# Patient Record
Sex: Male | Born: 2005 | Race: White | Hispanic: No | Marital: Single | Smoking: Never smoker
Health system: Southern US, Community
[De-identification: ages and names within clinical notes are randomized; demographics above are authoritative.]

## PROBLEM LIST (undated history)

## (undated) DIAGNOSIS — Z8489 Family history of other specified conditions: Secondary | ICD-10-CM

## (undated) DIAGNOSIS — J40 Bronchitis, not specified as acute or chronic: Secondary | ICD-10-CM

## (undated) DIAGNOSIS — F800 Phonological disorder: Secondary | ICD-10-CM

## (undated) DIAGNOSIS — L0591 Pilonidal cyst without abscess: Secondary | ICD-10-CM

## (undated) HISTORY — PX: ADENOIDECTOMY: SUR15

## (undated) HISTORY — PX: CLOSED REDUCTION CLAVICLE FRACTURE: SUR253

## (undated) HISTORY — DX: Phonological disorder: F80.0

## (undated) HISTORY — PX: TONSILLECTOMY: SUR1361

## (undated) HISTORY — PX: TYMPANOSTOMY TUBE PLACEMENT: SHX32

---
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
## 2023-01-12 ENCOUNTER — Other Ambulatory Visit: Payer: Self-pay

## 2023-01-12 ENCOUNTER — Ambulatory Visit: Payer: Medicaid Other | Admitting: Family Medicine

## 2023-01-12 ENCOUNTER — Other Ambulatory Visit (HOSPITAL_COMMUNITY): Payer: Self-pay

## 2023-01-12 ENCOUNTER — Encounter: Payer: Self-pay | Admitting: Family Medicine

## 2023-01-12 VITALS — BP 126/84 | HR 89 | Ht 68.0 in | Wt 264.0 lb

## 2023-01-12 DIAGNOSIS — M25512 Pain in left shoulder: Secondary | ICD-10-CM | POA: Diagnosis not present

## 2023-01-12 DIAGNOSIS — G8929 Other chronic pain: Secondary | ICD-10-CM

## 2023-01-12 MED ORDER — TIZANIDINE HCL 4 MG PO TABS
4.0000 mg | ORAL_TABLET | Freq: Three times a day (TID) | ORAL | 1 refills | Status: AC | PRN
  Filled 2023-01-12: qty 30, 10d supply, fill #0

## 2023-01-12 MED ORDER — MELOXICAM 15 MG PO TABS
ORAL_TABLET | ORAL | 3 refills | Status: AC
  Filled 2023-01-12: qty 30, 30d supply, fill #0

## 2023-01-12 NOTE — Progress Notes (Signed)
   I, Bryan Meadows, CMA acting as a scribe for Bryan Graham, MD.  Bryan Meadows. is a 17 y.o. male who presents to [facility] at [facility] today for shoulder pain. Pt was last seen by Dr. Denyse Amass on 04/27/22 for R shoulder pain and pronator syndrome.  Today, pt c/o left shoulder pain x 3 weeks. MOI:? Overhead reaching. Pt locates pain to posterior aspect of the shoulder. Sx worsening yesterday after shaking the vending machine, had tightness and SOB at the time. Also had pain while driving home from school. Denies current SOB. Sx worse with sneezing, coughing, twisting.   Aggravates: Motion and sleep Treatments tried: Nothing much yet  Dx imaging: 07/12/19 R shoulder XR  08/16/18 L shoulder & clavicle XR  Pertinent review of systems: No fevers or chills  Relevant historical information: Patient is in school to learn how to weld.  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   Exam:  BP 126/84   Pulse 89   Ht 5\' 8"  (1.727 m)   Wt 264 lb (119.7 kg)   SpO2 98%   BMI 40.14 kg/m  General: Well Developed, well nourished, and in no acute distress.   MSK: C-spine: Normal appearing Nontender palpation spinal midline. Tender palpation left rhomboid. Normal shoulder motion some pain with abduction. Intact strength.  Negative impingement testing.       Assessment and Plan: 17 y.o. male with left periscapular shoulder pain.  Pain is concentrated around the rhomboid region.  This been has been ongoing chronically now for about 3 weeks with an acute exacerbation yesterday after he pushed on a heavy vending machine.  He has suffered a muscle strain of the rhomboid muscle.  Plan for heating pad TENS unit percussive massager meloxicam tizanidine and Tylenol arthritis.  Plan for physical therapy as well.  If not improving consider further imaging.   PDMP not reviewed this encounter. Orders Placed This Encounter   Procedures   Ambulatory referral to Physical Therapy    Referral Priority:   Routine    Referral Type:   Physical Medicine    Referral Reason:   Specialty Services Required    Requested Specialty:   Physical Therapy    Number of Visits Requested:   1   Meds ordered this encounter  Medications   tiZANidine (ZANAFLEX) 4 MG tablet    Sig: Take 1 tablet (4 mg total) by mouth every 8 (eight) hours as needed.    Dispense:  30 tablet    Refill:  1   meloxicam (MOBIC) 15 MG tablet    Sig: Take 1 tablet by mouth in the morning with breakfast for 2 weeks, then daily as needed for pain.    Dispense:  30 tablet    Refill:  3     Discussed warning signs or symptoms. Please see discharge instructions. Patient expresses understanding.   The above documentation has been reviewed and is accurate and complete Bryan Meadows, M.D.

## 2023-01-12 NOTE — Patient Instructions (Addendum)
Thank you for coming in today.   Tylenol arthritis up to 2 every 8 hours.   OK to take with meloxicam.   Do not take meloxicam with ibuprofen.   Heating pad and TENS unit.   Percussive massager.    I've referred you to Physical Therapy.  Let us know if you don't hear from them in one week.

## 2023-01-14 ENCOUNTER — Telehealth: Payer: Self-pay | Admitting: Family Medicine

## 2023-01-14 DIAGNOSIS — G8929 Other chronic pain: Secondary | ICD-10-CM

## 2023-01-14 NOTE — Telephone Encounter (Signed)
[facility] rehab called, they stated we referred pt to them for dry needling but they are not providing that service at this time.

## 2023-01-17 NOTE — Telephone Encounter (Signed)
Dr. Denyse Amass, is there a different location that you would like to try to send pt to for dry-needling?

## 2023-01-18 NOTE — Addendum Note (Signed)
Addended by: Rodolph Bong on: 01/18/2023 07:24 AM   Modules accepted: Orders

## 2023-01-18 NOTE — Telephone Encounter (Signed)
I changed the location to [facility] in [place].

## 2023-01-18 NOTE — Telephone Encounter (Signed)
Called pt and advised. He will reach out if he hasn't heard from PT by the end of the week.

## 2023-01-27 ENCOUNTER — Other Ambulatory Visit: Payer: Self-pay

## 2023-01-27 ENCOUNTER — Ambulatory Visit (HOSPITAL_COMMUNITY): Payer: Medicaid Other | Attending: Family Medicine

## 2023-01-27 DIAGNOSIS — R29898 Other symptoms and signs involving the musculoskeletal system: Secondary | ICD-10-CM | POA: Insufficient documentation

## 2023-01-27 DIAGNOSIS — M25512 Pain in left shoulder: Secondary | ICD-10-CM | POA: Diagnosis present

## 2023-01-27 DIAGNOSIS — R293 Abnormal posture: Secondary | ICD-10-CM | POA: Insufficient documentation

## 2023-01-27 DIAGNOSIS — G8929 Other chronic pain: Secondary | ICD-10-CM | POA: Diagnosis present

## 2023-01-27 NOTE — Patient Instructions (Signed)

## 2023-01-27 NOTE — Therapy (Signed)
OUTPATIENT PHYSICAL THERAPY SHOULDER EVALUATION   Patient Name: Bryan Meadows. MRN: [id number] DOB:03-Nov-2005, 17 y.o., male Today's Date: 01/27/2023  END OF SESSION:  PT End of Session - 01/27/23 1331     Visit Number 1    Number of Visits 8    Date for PT Re-Evaluation 02/24/23    Authorization Type [place] Medicaid Healthy Blue (auth requested)    PT Start Time 1332    PT Stop Time 1420    PT Time Calculation (min) 48 min    Activity Tolerance Patient tolerated treatment well    Behavior During Therapy WFL for tasks assessed/performed             Past Medical History:  Diagnosis Date   Articulation disorder    moderate.   Asthma    Bronchitis    Family history of adverse reaction to anesthesia    dad wakes up "fighting"   Upper respiratory tract infection 02/13/2020   Past Surgical History:  Procedure Laterality Date   ADENOIDECTOMY     CLOSED REDUCTION CLAVICLE FRACTURE     PILONIDAL CYST EXCISION N/A 01/20/2021   Procedure: PILONIDAL CYSTECTOMY;  Surgeon: Manus Rudd, MD;  Location: [facility];  Service: General;  Laterality: N/A;   TONSILLECTOMY     TYMPANOSTOMY TUBE PLACEMENT     Patient Active Problem List   Diagnosis Date Noted   Contact dermatitis due to urushiol 04/06/2022   Asthma, mild intermittent 11/05/2015   Articulation disorder     PCP: Lynnea Ferrier, MD  REFERRING PROVIDER: Rodolph Bong, MD  REFERRING DIAG:  Diagnosis  [phone] (ICD-10-CM) - Chronic left shoulder pain    THERAPY DIAG:  Chronic left shoulder pain  Rationale for Evaluation and Treatment: Rehabilitation  ONSET DATE: 1 month  SUBJECTIVE:                                                                                                                                                                                      SUBJECTIVE STATEMENT: Shoulder has been hurting for about a month; shook a vending machine and it made his shoulder  cramp/spasm; made him catch his breath. 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  Has a note now no lifting overhead > 50 lbs or lifting over 100 lbs below shoulder height; sometimes shoulder cramps at night and wakes him  Hand dominance: Right  PERTINENT HISTORY: History of right shoulder pain years ago.    PAIN:  Are you having pain? Yes: NPRS scale:  0 to 6-7/10 Pain location: left  posterior shoulder Pain description: sharp pain, intermittent and varies Aggravating factors: prolonged movement Relieving factors: ibuprofen  PRECAUTIONS: None  RED FLAGS: None   WEIGHT BEARING RESTRICTIONS: No  FALLS:  Has patient fallen in last 6 months? No  OCCUPATION: Student (welding); works at [employer]  PLOF: Independent  PATIENT GOALS:no pain in my shoulder; sleep good at night  NEXT MD VISIT: PRN  OBJECTIVE:  Note: Objective measures were completed at Evaluation unless otherwise noted.  DIAGNOSTIC FINDINGS:  None recent  PATIENT SURVEYS:  Quick Dash 25/100 25%  COGNITION: Overall cognitive status: Within functional limits for tasks assessed     SENSATION: WFL  POSTURE: Forward head and rounded shoulders; slight slumped poster in sitting  UPPER EXTREMITY ROM:   Active ROM Right eval Left eval  Shoulder flexion 5 4+  Shoulder extension    Shoulder abduction 5 5  Shoulder adduction    Shoulder internal rotation 5 5  Shoulder external rotation 5 4  Elbow flexion    Elbow extension    Wrist flexion    Wrist extension    Wrist ulnar deviation    Wrist radial deviation    Wrist pronation    Wrist supination    (Blank rows = not tested)  UPPER EXTREMITY MMT:  MMT Right eval Left eval  Shoulder flexion 172 166  Shoulder extension    Shoulder abduction 180 170  Shoulder adduction    Shoulder internal rotation wfl wfl  Shoulder external rotation 88 66*  Middle  trapezius    Lower trapezius    Elbow flexion    Elbow extension    Wrist flexion    Wrist extension    Wrist ulnar deviation    Wrist radial deviation    Wrist pronation    Wrist supination    Grip strength (lbs)    (Blank rows = not tested)  SHOULDER SPECIAL TESTS:  Instability tests: Sulcus sign: negative Rotator cuff assessment: Drop arm test: negative Biceps assessment: Yergason's test: negative  JOINT MOBILITY TESTING:    PALPATION:  Tender left periscapular area; rhomboids, levator   TODAY'S TREATMENT:                                                                                                                                         DATE: 01/27/23 physical therapy evaluation and HEP instruction   PATIENT EDUCATION: Education details: Patient educated on exam findings, POC, scope of PT, HEP, and what to expect next visit. Person educated: Patient Education method: Explanation, Demonstration, and Handouts Education comprehension: verbalized understanding, returned demonstration, verbal cues required, and tactile cues required  HOME EXERCISE PROGRAM: Access Code: [email or web address] URL: [email or web address] Date: 01/27/2023 Prepared by: AP - Rehab  Exercises - Supine Shoulder External Rotation with Dowel  - 2 x daily - 7 x weekly - 1 sets - 10 reps - Seated Scapular Retraction  - 2 x  daily - 7 x weekly - 1 sets - 10 reps - 5" hold - Correct Seated Posture  - 1 x daily - 7 x weekly - 3 sets - 10 reps - Supine Mid-Thoracic Mobilization  - 1 x daily - 7 x weekly - 1 sets - 1 reps (use of tennis ball for trigger point release)  ASSESSMENT:  CLINICAL IMPRESSION: Patient is a 17 y.o. male who was seen today for physical therapy evaluation and treatment for chronic left shoulder pain.   Patient demonstrates decreased strength, ROM restriction, reduced flexibility, increased tenderness to palpation and postural abnormalities which are likely contributing to  symptoms of pain and are negatively impacting patient ability to perform ADLs. Patient will benefit from skilled physical therapy services to address these deficits to reduce pain and improve level of function with ADLs    OBJECTIVE IMPAIRMENTS: decreased activity tolerance, decreased mobility, decreased ROM, decreased strength, hypomobility, increased fascial restrictions, impaired perceived functional ability, increased muscle spasms, impaired flexibility, impaired UE functional use, and pain.   ACTIVITY LIMITATIONS: carrying, lifting, and reach over head  PARTICIPATION LIMITATIONS: driving, occupation, and school  PERSONAL FACTORS: Profession are also affecting patient's functional outcome.   REHAB POTENTIAL: Good  CLINICAL DECISION MAKING: Stable/uncomplicated  EVALUATION COMPLEXITY: Low   GOALS: Goals reviewed with patient? No  SHORT TERM GOALS: Target date: 02/10/2023  patient will be independent with initial HEP Baseline: Goal status: INITIAL  2.  Patient will self report 50% improvement to improve tolerance for functional activity  Baseline:  Goal status: INITIAL   LONG TERM GOALS: Target date: 02/24/2023  Patient will be independent in self management strategies to improve quality of life and functional outcomes.  Baseline:  Goal status: INITIAL  2.  Patient will self report 75% improvement to improve tolerance for functional activity  Baseline:  Goal status: INITIAL  3.  Patient will increase left shoulder external rotation to 80 degrees to improve ability to reach for work and school activities. Baseline: 66 Goal status: INITIAL  4.  Patient will be able to resume all normal work and school activities with pain no greater than 2/10 Baseline: 6/10 Goal status: INITIAL  5.  Patient will be able to sleep through the night without pain Baseline:  Goal status: INITIAL  6.  Patient will improve the QDASH score to 15 or less to demonstrate improved  perceived function left UE.  Baseline: 25/100 Goal status: INITIAL  PLAN:  PT FREQUENCY: 2x/week  PT DURATION: 4 weeks  PLANNED INTERVENTIONS: 97164- PT Re-evaluation, 97110-Therapeutic exercises, 97530- Therapeutic activity, 97112- Neuromuscular re-education, 97535- Self Care, 95621- Manual therapy, [phone]- Gait training, [phone]- Orthotic Fit/training, [phone]- Canalith repositioning, [id number]- Aquatic Therapy, [phone]- Splinting, Patient/Family education, Balance training, Stair training, Taping, Dry Needling, Joint mobilization, Joint manipulation, Spinal manipulation, Spinal mobilization, Scar mobilization, and DME instructions.   PLAN FOR NEXT SESSION: Review HEP and goals; consider dry needling if symptoms do not improve after a couple visits; continue with left shoulder mobility and strengthening, postural strengthening; check cervical spine mobility; add doorway stretch; manual as needed.    2:17 PM, 01/27/23 Shanin Szymanowski Small Mohogany Toppins MPT [facility] [place] [phone] [phone]   Managed Medicaid Authorization Request  Visit Dx Codes: M25.512, G89.29, R29.3; R29.898  Functional Tool Score: QDASH 25/100; 25%  For all possible CPT codes, reference the Planned Interventions line above.     Check all conditions that are expected to impact treatment: {Conditions expected to impact treatment:None of these apply   If treatment  provided at initial evaluation, no treatment charged due to lack of authorization.

## 2023-02-11 ENCOUNTER — Ambulatory Visit (HOSPITAL_COMMUNITY): Payer: Medicaid Other | Attending: Family Medicine

## 2023-02-11 DIAGNOSIS — G8929 Other chronic pain: Secondary | ICD-10-CM | POA: Diagnosis present

## 2023-02-11 DIAGNOSIS — R29898 Other symptoms and signs involving the musculoskeletal system: Secondary | ICD-10-CM | POA: Diagnosis present

## 2023-02-11 DIAGNOSIS — R293 Abnormal posture: Secondary | ICD-10-CM | POA: Insufficient documentation

## 2023-02-11 DIAGNOSIS — M25512 Pain in left shoulder: Secondary | ICD-10-CM | POA: Insufficient documentation

## 2023-02-11 NOTE — Therapy (Signed)
 OUTPATIENT PHYSICAL THERAPY SHOULDER TREATMENT/ DISCHARGE PHYSICAL THERAPY DISCHARGE SUMMARY  Visits from Start of Care: 2  Current functional level related to goals / functional outcomes: See below   Remaining deficits: See below   Education / Equipment: HEP   Patient agrees to discharge. Patient goals were met. Patient is being discharged due to meeting the stated rehab goals.    Patient Name: Bryan Meadows. MRN: [id number] DOB:March 26, 2005, 18 y.o., male Today's Date: 02/11/2023  END OF SESSION:  PT End of Session - 02/11/23 1128     Visit Number 2    Number of Visits 8    Date for PT Re-Evaluation 02/24/23    Authorization Type [place] Medicaid Healthy Blue (auth requested)    Authorization Time Period 5 visits from 01/27/23 to 03/27/23    Authorization - Visit Number 1    Authorization - Number of Visits 5    PT Start Time 1129    PT Stop Time 1145    PT Time Calculation (min) 16 min    Activity Tolerance Patient tolerated treatment well    Behavior During Therapy [facility] for tasks assessed/performed             Past Medical History:  Diagnosis Date   Articulation disorder    moderate.   Asthma    Bronchitis    Family history of adverse reaction to anesthesia    dad wakes up fighting   Upper respiratory tract infection 02/13/2020   Past Surgical History:  Procedure Laterality Date   ADENOIDECTOMY     CLOSED REDUCTION CLAVICLE FRACTURE     PILONIDAL CYST EXCISION N/A 01/20/2021   Procedure: PILONIDAL CYSTECTOMY;  Surgeon: Belinda Cough, MD;  Location: [facility];  Service: General;  Laterality: N/A;   TONSILLECTOMY     TYMPANOSTOMY TUBE PLACEMENT     Patient Active Problem List   Diagnosis Date Noted   Contact dermatitis due to urushiol 04/06/2022   Asthma, mild intermittent 11/05/2015   Articulation disorder     PCP: Butler Burr, MD  REFERRING PROVIDER: Joane Artist RAMAN, MD  REFERRING DIAG:  Diagnosis  [phone]  (ICD-10-CM) - Chronic left shoulder pain    THERAPY DIAG:  Chronic left shoulder pain  Abnormal posture  Other symptoms and signs involving the musculoskeletal system  Rationale for Evaluation and Treatment: Rehabilitation  ONSET DATE: 1 month  SUBJECTIVE:                                                                                                                                                                                      SUBJECTIVE STATEMENT: Much better!  Feels like 85%-90%  better   EVAL:Shoulder has been hurting for about a month; shook a vending machine and it made his shoulder cramp/spasm; made him catch his breath. 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  Has a note now no lifting overhead > 50 lbs or lifting over 100 lbs below shoulder height; sometimes shoulder cramps at night and wakes him  Hand dominance: Right  PERTINENT HISTORY: History of right shoulder pain years ago.    PAIN:  Are you having pain? Yes: NPRS scale:  0 to 6-7/10 Pain location: left posterior shoulder Pain description: sharp pain, intermittent and varies Aggravating factors: prolonged movement Relieving factors: ibuprofen   PRECAUTIONS: None  RED FLAGS: None   WEIGHT BEARING RESTRICTIONS: No  FALLS:  Has patient fallen in last 6 months? No  OCCUPATION: Student (welding); works at [employer]  PLOF: Independent  PATIENT GOALS:no pain in my shoulder; sleep good at night  NEXT MD VISIT: PRN  OBJECTIVE:  Note: Objective measures were completed at Evaluation unless otherwise noted.  DIAGNOSTIC FINDINGS:  None recent  PATIENT SURVEYS:  Quick Dash 25/100 25%  COGNITION: Overall cognitive status: Within functional limits for tasks assessed     SENSATION: WFL  POSTURE: Forward head and rounded shoulders; slight slumped poster in sitting  UPPER EXTREMITY ROM:   Active ROM  Right eval Left eval Left 02/11/23  Shoulder flexion 5 4+ 5  Shoulder extension     Shoulder abduction 5 5   Shoulder adduction     Shoulder internal rotation 5 5   Shoulder external rotation 5 4 5   Elbow flexion     Elbow extension     Wrist flexion     Wrist extension     Wrist ulnar deviation     Wrist radial deviation     Wrist pronation     Wrist supination     (Blank rows = not tested)  UPPER EXTREMITY MMT:  MMT Right eval Left eval Left 02/11/23  Shoulder flexion 172 166 170  Shoulder extension     Shoulder abduction 180 170   Shoulder adduction     Shoulder internal rotation wfl wfl   Shoulder external rotation 88 66* 90  Middle trapezius     Lower trapezius     Elbow flexion     Elbow extension     Wrist flexion     Wrist extension     Wrist ulnar deviation     Wrist radial deviation     Wrist pronation     Wrist supination     Grip strength (lbs)     (Blank rows = not tested)  SHOULDER SPECIAL TESTS:  Instability tests: Sulcus sign: negative Rotator cuff assessment: Drop arm test: negative Biceps assessment: Yergason's test: negative  JOINT MOBILITY TESTING:    PALPATION:  Tender left periscapular area; rhomboids, levator   TODAY'S TREATMENT:  DATE:  02/11/23 reasess see above 01/27/23 physical therapy evaluation and HEP instruction   PATIENT EDUCATION: Education details: Patient educated on exam findings, POC, scope of PT, HEP, and what to expect next visit. Person educated: Patient Education method: Explanation, Demonstration, and Handouts Education comprehension: verbalized understanding, returned demonstration, verbal cues required, and tactile cues required  HOME EXERCISE PROGRAM: Access Code: [email or web address] URL: [email or web address] Date: 01/27/2023 Prepared by: AP - Rehab  Exercises -  Supine Shoulder External Rotation with Dowel  - 2 x daily - 7 x weekly - 1 sets - 10 reps - Seated Scapular Retraction  - 2 x daily - 7 x weekly - 1 sets - 10 reps - 5 hold - Correct Seated Posture  - 1 x daily - 7 x weekly - 3 sets - 10 reps - Supine Mid-Thoracic Mobilization  - 1 x daily - 7 x weekly - 1 sets - 1 reps (use of tennis ball for trigger point release)  ASSESSMENT:  CLINICAL IMPRESSION: Reassessed patient today as he feel close to his baseline. Patient has met all set rehab goals and is agreeable to discharge at this time.    Eval:Patient is a 18 y.o. male who was seen today for physical therapy evaluation and treatment for chronic left shoulder pain.   Patient demonstrates decreased strength, ROM restriction, reduced flexibility, increased tenderness to palpation and postural abnormalities which are likely contributing to symptoms of pain and are negatively impacting patient ability to perform ADLs. Patient will benefit from skilled physical therapy services to address these deficits to reduce pain and improve level of function with ADLs    OBJECTIVE IMPAIRMENTS: decreased activity tolerance, decreased mobility, decreased ROM, decreased strength, hypomobility, increased fascial restrictions, impaired perceived functional ability, increased muscle spasms, impaired flexibility, impaired UE functional use, and pain.   ACTIVITY LIMITATIONS: carrying, lifting, and reach over head  PARTICIPATION LIMITATIONS: driving, occupation, and school  PERSONAL FACTORS: Profession are also affecting patient's functional outcome.   REHAB POTENTIAL: Good  CLINICAL DECISION MAKING: Stable/uncomplicated  EVALUATION COMPLEXITY: Low   GOALS: Goals reviewed with patient? No  SHORT TERM GOALS: Target date: 02/10/2023  patient will be independent with initial HEP Baseline: Goal status: INITIAL  2.  Patient will self report 50% improvement to improve tolerance for functional  activity  Baseline:  Goal status: INITIAL   LONG TERM GOALS: Target date: 02/24/2023  Patient will be independent in self management strategies to improve quality of life and functional outcomes.  Baseline:  Goal status: INITIAL  2.  Patient will self report 75% improvement to improve tolerance for functional activity  Baseline:  Goal status: INITIAL  3.  Patient will increase left shoulder external rotation to 80 degrees to improve ability to reach for work and school activities. Baseline: 66 Goal status: INITIAL  4.  Patient will be able to resume all normal work and school activities with pain no greater than 2/10 Baseline: 6/10 Goal status: INITIAL  5.  Patient will be able to sleep through the night without pain Baseline:  Goal status: INITIAL  6.  Patient will improve the QDASH score to 15 or less to demonstrate improved perceived function left UE.  Baseline: 25/100 Goal status: INITIAL  PLAN:  PT FREQUENCY: 2x/week  PT DURATION: 4 weeks  PLANNED INTERVENTIONS: 97164- PT Re-evaluation, 97110-Therapeutic exercises, 97530- Therapeutic activity, [id number]- Neuromuscular re-education, 97535- Self Care, 02859- Manual therapy, [phone]- Gait training, [phone]- Orthotic Fit/training, [phone]- Canalith repositioning, [id number]- Aquatic Therapy, [phone]- Splinting, Patient/Family education, Balance training, Stair  training, Taping, Dry Needling, Joint mobilization, Joint manipulation, Spinal manipulation, Spinal mobilization, Scar mobilization, and DME instructions.   PLAN FOR NEXT SESSION: discharge   11:46 AM, 02/11/23 Alyshia Kernan Small Kimble Delaurentis MPT [facility] [place] [phone]

## 2023-02-14 ENCOUNTER — Emergency Department (HOSPITAL_COMMUNITY): Payer: Medicaid Other

## 2023-02-14 ENCOUNTER — Emergency Department (HOSPITAL_COMMUNITY)
Admission: EM | Admit: 2023-02-14 | Discharge: 2023-02-15 | Discharge status: Against Medical Advice | Payer: Medicaid Other | Source: Home / Self Care

## 2023-02-14 ENCOUNTER — Emergency Department (HOSPITAL_COMMUNITY)
Admission: EM | Admit: 2023-02-14 | Discharge: 2023-02-14 | Disposition: A | Discharge status: Home / Self Care | Payer: Medicaid Other | Attending: Emergency Medicine | Admitting: Emergency Medicine

## 2023-02-14 ENCOUNTER — Other Ambulatory Visit (HOSPITAL_COMMUNITY): Payer: Self-pay

## 2023-02-14 ENCOUNTER — Encounter (HOSPITAL_COMMUNITY): Payer: Self-pay | Admitting: *Deleted

## 2023-02-14 ENCOUNTER — Other Ambulatory Visit: Payer: Self-pay

## 2023-02-14 DIAGNOSIS — R1032 Left lower quadrant pain: Secondary | ICD-10-CM | POA: Insufficient documentation

## 2023-02-14 DIAGNOSIS — Z7951 Long term (current) use of inhaled steroids: Secondary | ICD-10-CM | POA: Diagnosis not present

## 2023-02-14 DIAGNOSIS — R1012 Left upper quadrant pain: Secondary | ICD-10-CM | POA: Diagnosis not present

## 2023-02-14 DIAGNOSIS — Z5321 Procedure and treatment not carried out due to patient leaving prior to being seen by health care provider: Secondary | ICD-10-CM | POA: Diagnosis not present

## 2023-02-14 DIAGNOSIS — R1033 Periumbilical pain: Secondary | ICD-10-CM | POA: Insufficient documentation

## 2023-02-14 DIAGNOSIS — J45909 Unspecified asthma, uncomplicated: Secondary | ICD-10-CM | POA: Insufficient documentation

## 2023-02-14 DIAGNOSIS — R109 Unspecified abdominal pain: Secondary | ICD-10-CM | POA: Insufficient documentation

## 2023-02-14 LAB — COMPREHENSIVE METABOLIC PANEL
ALT: 38 U/L (ref 0–44)
AST: 24 U/L (ref 15–41)
Albumin: 4.7 g/dL (ref 3.5–5.0)
Alkaline Phosphatase: 78 U/L (ref 38–126)
Anion gap: 9 (ref 5–15)
BUN: 12 mg/dL (ref 6–20)
CO2: 22 mmol/L (ref 22–32)
Calcium: 9.4 mg/dL (ref 8.9–10.3)
Chloride: 104 mmol/L (ref 98–111)
Creatinine, Ser: 0.86 mg/dL (ref 0.61–1.24)
GFR, Estimated: >60 mL/min (ref >60)
Glucose, Bld: 105 mg/dL — ABNORMAL HIGH (ref 70–99)
Potassium: 4.3 mmol/L (ref 3.5–5.1)
Sodium: 135 mmol/L (ref 135–145)
Total Bilirubin: 0.9 mg/dL (ref 0.0–1.2)
Total Protein: 7.6 g/dL (ref 6.5–8.1)

## 2023-02-14 LAB — URINALYSIS, ROUTINE W REFLEX MICROSCOPIC
Bilirubin Urine: NEGATIVE
Glucose, UA: NEGATIVE mg/dL
Hgb urine dipstick: NEGATIVE
Ketones, ur: NEGATIVE mg/dL
Leukocytes,Ua: NEGATIVE
Nitrite: NEGATIVE
Protein, ur: NEGATIVE mg/dL
Specific Gravity, Urine: >1.046 — ABNORMAL HIGH (ref 1.005–1.030)
pH: 6 (ref 5.0–8.0)

## 2023-02-14 LAB — CBC WITH DIFFERENTIAL/PLATELET
Abs Immature Granulocytes: 0.01 10*3/uL (ref 0.00–0.07)
Basophils Absolute: 0.1 10*3/uL (ref 0.0–0.1)
Basophils Relative: 1 %
Eosinophils Absolute: 0.2 10*3/uL (ref 0.0–0.5)
Eosinophils Relative: 3 %
HCT: 45.8 % (ref 39.0–52.0)
Hemoglobin: 16.1 g/dL (ref 13.0–17.0)
Immature Granulocytes: 0 %
Lymphocytes Relative: 23 %
Lymphs Abs: 1.9 10*3/uL (ref 0.7–4.0)
MCH: 30 pg (ref 26.0–34.0)
MCHC: 35.2 g/dL (ref 30.0–36.0)
MCV: 85.4 fL (ref 80.0–100.0)
Monocytes Absolute: 0.6 10*3/uL (ref 0.1–1.0)
Monocytes Relative: 8 %
Neutro Abs: 5.3 10*3/uL (ref 1.7–7.7)
Neutrophils Relative %: 65 %
Platelets: 237 10*3/uL (ref 150–400)
RBC: 5.36 MIL/uL (ref 4.22–5.81)
RDW: 12 % (ref 11.5–15.5)
WBC: 8.1 10*3/uL (ref 4.0–10.5)
nRBC: 0 % (ref 0.0–0.2)

## 2023-02-14 LAB — LIPASE, BLOOD: Lipase: 26 U/L (ref 11–51)

## 2023-02-14 MED ORDER — IOHEXOL 300 MG/ML  SOLN
100.0000 mL | Freq: Once | INTRAMUSCULAR | Status: AC | PRN
  Administered 2023-02-14: 100 mL via INTRAVENOUS

## 2023-02-14 MED ORDER — DICYCLOMINE HCL 10 MG PO CAPS
20.0000 mg | ORAL_CAPSULE | Freq: Once | ORAL | Status: AC
  Administered 2023-02-14: 20 mg via ORAL
  Filled 2023-02-14: qty 2

## 2023-02-14 MED ORDER — DICYCLOMINE HCL 20 MG PO TABS
20.0000 mg | ORAL_TABLET | Freq: Three times a day (TID) | ORAL | 0 refills | Status: AC
  Filled 2023-02-14: qty 20, 7d supply, fill #0

## 2023-02-14 MED ORDER — KETOROLAC TROMETHAMINE 15 MG/ML IJ SOLN
15.0000 mg | Freq: Once | INTRAMUSCULAR | Status: AC
  Administered 2023-02-14: 15 mg via INTRAVENOUS
  Filled 2023-02-14: qty 1

## 2023-02-14 NOTE — ED Triage Notes (Signed)
 Reports abd pain mid abd, stared last night. Denies N/V/D

## 2023-02-14 NOTE — Discharge Instructions (Addendum)
 If you develop worsening, continued, or recurrent abdominal pain, uncontrolled vomiting, fever, chest or back pain, or any other new/concerning symptoms then return to the ER for evaluation.

## 2023-02-14 NOTE — ED Triage Notes (Signed)
 Patient from home for abd pain that started last night. Was seen at [facility] earlier today and left with no answers. Denies any N/V/D, urinary changes, or constipation. Last BM approx 1.5 hours ago and states it was normal. Upon arrival to ER, patient is alert and oriented, ambu

## 2023-02-14 NOTE — ED Provider Notes (Signed)
 [place] EMERGENCY DEPARTMENT AT [facility] Provider Note   CSN: [id number] Arrival date & time: 02/14/23  9141     History  Chief Complaint  Patient presents with   Abdominal Pain    Bryan Meadows. is a 18 y.o. male.  HPI 18 year old man with a history of asthma presents with severe abdominal pain.  He states that his woke him up from sleep around 2 AM.  Took some ibuprofen  which did not help.  The pain is primarily umbilical and rated as a 10/10.  Feels like a cramping.  No urinary symptoms.  He had a couple episodes of diarrhea yesterday but was not having the pain at the time.  Pain is about the same as when it first started.  No prior history of abdominal issues.  No fevers or vomiting.  Home Medications Prior to Admission medications   Medication Sig Start Date End Date Taking? Authorizing Provider  dicyclomine  (BENTYL ) 20 MG tablet Take 1 tablet (20 mg total) by mouth 3 (three) times daily before meals. 02/14/23  Yes Freddi Hamilton, MD  albuterol  (PROVENTIL ) (2.5 MG/3ML) 0.083% nebulizer solution Take 3 mLs (2.5 mg total) by nebulization every 4 (four) hours as needed for wheezing. For shortness of breath 12/31/19   Duanne Butler DASEN, MD  albuterol  (VENTOLIN  HFA) 108 [phone] Base) MCG/ACT inhaler Inhale 1-2 puffs into the lungs every 6 (six) hours as needed for wheezing or shortness of breath. 01/21/22   Kayla Jeoffrey RAMAN, FNP  Clobetasol  Prop Emollient Base (CLOBETASOL  PROPIONATE E) 0.05 % emollient cream Apply 1 Application topically 2 (two) times daily. 07/15/22   Kayla Jeoffrey RAMAN, FNP  meloxicam  (MOBIC ) 15 MG tablet Take 1 tablet by mouth in the morning with breakfast for 2 weeks, then daily as needed for pain. 01/12/23   Joane Artist RAMAN, MD  penicillin  v potassium (VEETID) 500 MG tablet Take 2 tablets by mouth now, then 1 tablet 4 times daily until finished for dental infection. 09/10/22   Stuart Clancy Heidelberg, DDS  tiZANidine  (ZANAFLEX ) 4 MG tablet Take 1 tablet (4 mg  total) by mouth every 8 (eight) hours as needed. 01/12/23   Corey, Evan S, MD      Allergies    Patient has no known allergies.    Review of Systems   Review of Systems  Constitutional:  Negative for fever.  Gastrointestinal:  Positive for abdominal pain. Negative for vomiting.  Genitourinary:  Negative for dysuria.    Physical Exam Updated Vital Signs BP 132/78   Pulse 70   Temp 98 F (36.7 C)   Resp 15   Ht 5' 8 (1.727 m)   Wt 108.9 kg   SpO2 100%   BMI 36.49 kg/m  Physical Exam Vitals and nursing note reviewed.  Constitutional:      General: He is not in acute distress.    Appearance: He is well-developed. He is obese. He is not ill-appearing or diaphoretic.     Comments: Sitting up, resting comfortably, no acute distress  HENT:     Head: Normocephalic and atraumatic.  Cardiovascular:     Rate and Rhythm: Normal rate and regular rhythm.     Heart sounds: Normal heart sounds.  Pulmonary:     Effort: Pulmonary effort is normal.     Breath sounds: Normal breath sounds.  Abdominal:     Palpations: Abdomen is soft.     Tenderness: There is abdominal tenderness in the periumbilical area, left upper quadrant and  left lower quadrant.  Skin:    General: Skin is warm and dry.  Neurological:     Mental Status: He is alert.     ED Results / Procedures / Treatments   Labs (all labs ordered are listed, but only abnormal results are displayed) Labs Reviewed  COMPREHENSIVE METABOLIC PANEL - Abnormal; Notable for the following components:      Result Value   Glucose, Bld 105 (*)    All other components within normal limits  URINALYSIS, ROUTINE W REFLEX MICROSCOPIC - Abnormal; Notable for the following components:   Specific Gravity, Urine >1.046 (*)    All other components within normal limits  LIPASE, BLOOD  CBC WITH DIFFERENTIAL/PLATELET    EKG None  Radiology CT ABDOMEN PELVIS W CONTRAST Result Date: 02/14/2023 CLINICAL DATA:  Mid abdominal pain starting last  night EXAM: CT ABDOMEN AND PELVIS WITH CONTRAST TECHNIQUE: Multidetector CT imaging of the abdomen and pelvis was performed using the standard protocol following bolus administration of intravenous contrast. RADIATION DOSE REDUCTION: This exam was performed according to the departmental dose-optimization program which includes automated exposure control, adjustment of the mA and/or kV according to patient size and/or use of iterative reconstruction technique. CONTRAST:  OMNIPAQUE  IOHEXOL  300 MG/ML  SOLN COMPARISON:  None Available. 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 Loss of disc height with rudimentary disc material at this level. There is mild right foraminal impingement at this level due to intervertebral spurring. Grade 1 degenerative retrolisthesis at L3-4 and grade 1 degenerative anterolisthesis at L5-S1. Central disc protrusion along with facet arthropathy at L3-4 contribute to moderate left eccentric central narrowing of the thecal sac and right greater than left bilateral foraminal stenosis. L5-S1, facet spurring causes mild left foraminal stenosis. IMPRESSION: 1. No acute findings. 2. 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 There is mild right foraminal impingement at this level due to intervertebral spurring. 3. Central disc protrusion along with facet arthropathy at L3-4 contribute to moderate left eccentric central narrowing of the thecal sac and right greater than left bilateral foraminal stenosis. 4. Mild left foraminal stenosis at L5-S1 due to facet spurring. Electronically  Signed   By: Ryan Salvage M.D.   On: 02/14/2023 11:44    Procedures Procedures    Medications Ordered in ED Medications  ketorolac  (TORADOL ) 15 MG/ML injection 15 mg (15 mg Intravenous Given 02/14/23 1012)  iohexol  (OMNIPAQUE ) 300 MG/ML solution 100 mL (100 mLs Intravenous Contrast Given 02/14/23 1058)  dicyclomine  (BENTYL ) capsule 20 mg (20 mg Oral Given 02/14/23 1259)    ED Course/ Medical Decision Making/ A&P                                 Medical Decision Making Amount and/or Complexity of Data Reviewed Labs:     Details: Normal WBC Radiology: independent interpretation performed.    Details: No diverticulitis  Risk Prescription drug management.   Unclear what is causing the patient's abdominal pain.  CT is reassuring.  Lab work is unremarkable.  Had some moderate relief with Toradol  and then was given some Bentyl  and feels a lot better.  Unclear of the cause but there are no emergent conditions  found, he is feeling better, and I think stable for discharge to follow-up with his PCP.  He is tolerating p.o. fluids and food.  There are some chronic spinal abnormalities that do not appear to be symptomatic at this time and he can follow-up with his PCP for this as well.  Otherwise stable for discharge home with return precautions.        Final Clinical Impression(s) / ED Diagnoses Final diagnoses:  Periumbilical abdominal pain    Rx / DC Orders ED Discharge Orders          Ordered    dicyclomine  (BENTYL ) 20 MG tablet  3 times daily before meals        02/14/23 1430              Freddi Hamilton, MD 02/14/23 1523

## 2023-02-14 NOTE — ED Provider Triage Note (Signed)
 Emergency Medicine Provider Triage Evaluation Note  Bryan Meadows. , a 18 y.o. male  was evaluated in triage.  Pt complains of abdominal pain.  Review of Systems  Positive:  Negative:   Physical Exam  BP (!) 141/107 (BP Location: Left Arm)   Pulse 77   Temp 97.6 F (36.4 C) (Oral)   Resp 18   SpO2 97%  Gen:   Awake, no distress   Resp:  Normal effort  MSK:   Moves extremities without difficulty  Other:   Medical Decision Making  Medically screening exam initiated at 9:23 AM.  Appropriate orders placed.  Bryan Meadows. was informed that the remainder of the evaluation will be completed by another provider, this initial triage assessment does not replace that evaluation, and the importance of remaining in the ED until their evaluation is complete.  Central abdominal pain since last night. Patient stating that he does not otherwise feel sick. Patient last urinated around 3AM. Last BM yesterday.   Denies fever, chest pain, dyspnea, cough, nausea, vomiting, diarrhea, dysuria, hematuria, hematochezia.     Bryan Meadows, [place] 02/14/23 [phone]

## 2023-02-17 ENCOUNTER — Inpatient Hospital Stay: Payer: Medicaid Other | Admitting: Family Medicine

## 2023-02-18 ENCOUNTER — Encounter (HOSPITAL_COMMUNITY): Payer: Medicaid Other

## 2023-02-23 ENCOUNTER — Encounter (HOSPITAL_COMMUNITY): Payer: Medicaid Other

## 2023-04-06 ENCOUNTER — Other Ambulatory Visit (HOSPITAL_COMMUNITY): Payer: Self-pay

## 2023-04-06 MED ORDER — DOXYCYCLINE HYCLATE 100 MG PO TABS
100.0000 mg | ORAL_TABLET | Freq: Two times a day (BID) | ORAL | 0 refills | Status: AC
  Filled 2023-04-06: qty 14, 7d supply, fill #0

## 2023-04-07 ENCOUNTER — Other Ambulatory Visit (HOSPITAL_COMMUNITY): Payer: Self-pay

## 2023-05-02 ENCOUNTER — Ambulatory Visit: Payer: Self-pay | Admitting: Surgery

## 2023-05-02 NOTE — H&P (View-Only) (Signed)
 Subjective    Chief Complaint: Follow-up (F/u pilonidal cyst)       History of Present Illness: Bryan Meadows is a 18 y.o. male who is seen today as an office consultation at the request of Dr. Tanya Nones for evaluation of Follow-up (F/u pilonidal cyst) .   Bryan Meadows is a 18 y.o. male who is presenting per request of [name] A who evaluated him on 11/27/20 for a pilonidal cyst.  He underwent bedside incision and drainage evacuating a moderate amount of purulent material with hair.     On 01/20/21, he underwent pilonidal cystectomy with primary closure over a Penrose drain by Dr. [name].  The drain came out on its own shortly after Christmas.  On 02/24/2021, there was a small opening at the lower end of the incision with some protruding granulation tissue.  The upper end of the incision was cauterized with silver nitrate.     Dr. [name] saw him on 04/03/21 at which time the posterior end of the incision was healing well but the lowest end of the incision still had a 1 cm opening.  He he was seen in urgent office 04/07/21 for I&D of superior aspect of incision, releasing purulent fluid.  He followed up with Dr. [name] at which time the area was healed.   His mother states it filled up with fluid in July but did not drain and resolved on its own.   11/27/2021, he presented to the office with pain, redness, swelling at the left superior gluteal cleft x 2 days.  He underwent bedside aspiration of 7 cc thick foul-smelling fluid followed by I&D releasing more purulence.  He is here for recheck and states the area is no longer painful.  It is not draining.  Denies fever/chills.  At that time, we recommended pilonidal cystectomy.  The patient deferred but returned to the office on 04/06/2023 with a recurrent abscess to the left of the superior portion of the gluteal cleft.  This was drained by Dr. [name].  That wound has now healed but he still has a persistent mass in this area.  He still  has hair growing from 2 open skin pits.       Medical History: Past Medical History      Past Medical History:  Diagnosis Date   Asthma, unspecified asthma severity, unspecified whether complicated, unspecified whether persistent (HHS-HCC)          Problem List     Patient Active Problem List  Diagnosis   Articulation disorder   Asthma, mild intermittent (HHS-HCC)   Bleeding nose   Right wrist pain        Past Surgical History       Past Surgical History:  Procedure Laterality Date   TONSILLECTOMY & ADENOIDECTOMY   03/2006   I&D Pilonidal Cyst   11/27/2020    [name], NP        Allergies  No Known Allergies     Medications Ordered Prior to Encounter        Current Outpatient Medications on File Prior to Visit  Medication Sig Dispense Refill   albuterol (PROVENTIL) 2.5 mg /3 mL (0.083 %) nebulizer solution Inhale into the lungs       beclomethasone dipropionate (QVAR REDIHALER HFA) 80 mcg/actuation inhaler Inhale into the lungs 2 (two) times daily        No current facility-administered medications on file prior to visit.        Family History  History reviewed. No pertinent family history.      Tobacco Use History  Social History       Tobacco Use  Smoking Status Never  Smokeless Tobacco Never        Social History  Social History        Socioeconomic History   Marital status: Single  Tobacco Use   Smoking status: Never   Smokeless tobacco: Never  Vaping Use   Vaping status: Never Used  Substance and Sexual Activity   Alcohol use: Never   Drug use: Never        Objective:         Vitals:    05/02/23 0832  PainSc: 0-No pain    Physical Exam    Constitutional:  WDWN in NAD, conversant, no obvious deformities; lying in bed comfortably No active infection noted in the pilonidal region.  He has at least 2 open skin pits with protruding hair.  To the left of the posterior portion of this area, there is a 1 cm area of  subcutaneous firmness.  The more inferior portion of the region shows a well-healed scar with no sign of infection or inflammation.       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  Recommend excision of this area.  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  We will try to schedule this on a Friday so he can recover over the weekend and not miss any of his classes.   Pilonidal cystectomy.  The surgical procedure has been discussed with the patient.  Potential risks, benefits, alternative treatments, and expected outcomes have been explained.  All of the patient's questions at this time have been answered.  The likelihood of reaching the patient's treatment goal is good.  The patient understands the proposed surgical procedure and wishes to proceed.     [name], MD  05/02/2023 9:40 AM

## 2023-05-02 NOTE — H&P (Signed)
 Subjective    Chief Complaint: Follow-up (F/u pilonidal cyst)       History of Present Illness: Bryan Meadows is a 18 y.o. male who is seen today as an office consultation at the request of Dr. Tanya Nones for evaluation of Follow-up (F/u pilonidal cyst) .   Bryan Meadows is a 18 y.o. male who is presenting per request of [name] A who evaluated him on 11/27/20 for a pilonidal cyst.  He underwent bedside incision and drainage evacuating a moderate amount of purulent material with hair.     On 01/20/21, he underwent pilonidal cystectomy with primary closure over a Penrose drain by Dr. [name].  The drain came out on its own shortly after Christmas.  On 02/24/2021, there was a small opening at the lower end of the incision with some protruding granulation tissue.  The upper end of the incision was cauterized with silver nitrate.     Dr. [name] saw him on 04/03/21 at which time the posterior end of the incision was healing well but the lowest end of the incision still had a 1 cm opening.  He he was seen in urgent office 04/07/21 for I&D of superior aspect of incision, releasing purulent fluid.  He followed up with Dr. [name] at which time the area was healed.   His mother states it filled up with fluid in July but did not drain and resolved on its own.   11/27/2021, he presented to the office with pain, redness, swelling at the left superior gluteal cleft x 2 days.  He underwent bedside aspiration of 7 cc thick foul-smelling fluid followed by I&D releasing more purulence.  He is here for recheck and states the area is no longer painful.  It is not draining.  Denies fever/chills.  At that time, we recommended pilonidal cystectomy.  The patient deferred but returned to the office on 04/06/2023 with a recurrent abscess to the left of the superior portion of the gluteal cleft.  This was drained by Dr. [name].  That wound has now healed but he still has a persistent mass in this area.  He still  has hair growing from 2 open skin pits.       Medical History: Past Medical History      Past Medical History:  Diagnosis Date   Asthma, unspecified asthma severity, unspecified whether complicated, unspecified whether persistent (HHS-HCC)          Problem List     Patient Active Problem List  Diagnosis   Articulation disorder   Asthma, mild intermittent (HHS-HCC)   Bleeding nose   Right wrist pain        Past Surgical History       Past Surgical History:  Procedure Laterality Date   TONSILLECTOMY & ADENOIDECTOMY   03/2006   I&D Pilonidal Cyst   11/27/2020    [name], NP        Allergies  No Known Allergies     Medications Ordered Prior to Encounter        Current Outpatient Medications on File Prior to Visit  Medication Sig Dispense Refill   albuterol (PROVENTIL) 2.5 mg /3 mL (0.083 %) nebulizer solution Inhale into the lungs       beclomethasone dipropionate (QVAR REDIHALER HFA) 80 mcg/actuation inhaler Inhale into the lungs 2 (two) times daily        No current facility-administered medications on file prior to visit.        Family History  History reviewed. No pertinent family history.      Tobacco Use History  Social History       Tobacco Use  Smoking Status Never  Smokeless Tobacco Never        Social History  Social History        Socioeconomic History   Marital status: Single  Tobacco Use   Smoking status: Never   Smokeless tobacco: Never  Vaping Use   Vaping status: Never Used  Substance and Sexual Activity   Alcohol use: Never   Drug use: Never        Objective:         Vitals:    05/02/23 0832  PainSc: 0-No pain    Physical Exam    Constitutional:  WDWN in NAD, conversant, no obvious deformities; lying in bed comfortably No active infection noted in the pilonidal region.  He has at least 2 open skin pits with protruding hair.  To the left of the posterior portion of this area, there is a 1 cm area of  subcutaneous firmness.  The more inferior portion of the region shows a well-healed scar with no sign of infection or inflammation.       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  Recommend excision of this area.  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  We will try to schedule this on a Friday so he can recover over the weekend and not miss any of his classes.   Pilonidal cystectomy.  The surgical procedure has been discussed with the patient.  Potential risks, benefits, alternative treatments, and expected outcomes have been explained.  All of the patient's questions at this time have been answered.  The likelihood of reaching the patient's treatment goal is good.  The patient understands the proposed surgical procedure and wishes to proceed.     [name], MD  05/02/2023 9:40 AM

## 2023-05-05 ENCOUNTER — Encounter (HOSPITAL_BASED_OUTPATIENT_CLINIC_OR_DEPARTMENT_OTHER): Payer: Self-pay | Admitting: Surgery

## 2023-05-05 ENCOUNTER — Other Ambulatory Visit: Payer: Self-pay

## 2023-05-06 ENCOUNTER — Other Ambulatory Visit: Payer: Self-pay

## 2023-05-06 ENCOUNTER — Other Ambulatory Visit (HOSPITAL_COMMUNITY): Payer: Self-pay

## 2023-05-06 ENCOUNTER — Ambulatory Visit (HOSPITAL_BASED_OUTPATIENT_CLINIC_OR_DEPARTMENT_OTHER): Admitting: Anesthesiology

## 2023-05-06 ENCOUNTER — Encounter (HOSPITAL_BASED_OUTPATIENT_CLINIC_OR_DEPARTMENT_OTHER): Payer: Self-pay | Admitting: Surgery

## 2023-05-06 ENCOUNTER — Ambulatory Visit (HOSPITAL_BASED_OUTPATIENT_CLINIC_OR_DEPARTMENT_OTHER)
Admission: RE | Admit: 2023-05-06 | Discharge: 2023-05-06 | Disposition: A | Discharge status: Home / Self Care | Attending: Surgery | Admitting: Surgery

## 2023-05-06 ENCOUNTER — Encounter (HOSPITAL_BASED_OUTPATIENT_CLINIC_OR_DEPARTMENT_OTHER)
Admission: RE | Disposition: A | Discharge status: Home / Self Care | Payer: Self-pay | Source: Home / Self Care | Attending: Surgery

## 2023-05-06 DIAGNOSIS — L0501 Pilonidal cyst with abscess: Secondary | ICD-10-CM | POA: Diagnosis present

## 2023-05-06 HISTORY — DX: Pilonidal cyst without abscess: L05.91

## 2023-05-06 SURGERY — EXCISION, PILONIDAL CYST
Anesthesia: General

## 2023-05-06 MED ORDER — PROPOFOL 10 MG/ML IV BOLUS
INTRAVENOUS | Status: DC | PRN
  Administered 2023-05-06: 150 mg via INTRAVENOUS
  Administered 2023-05-06: 200 mg via INTRAVENOUS

## 2023-05-06 MED ORDER — BUPIVACAINE-EPINEPHRINE (PF) 0.25% -1:200000 IJ SOLN
INTRAMUSCULAR | Status: AC
  Filled 2023-05-06: qty 30

## 2023-05-06 MED ORDER — ONDANSETRON HCL 4 MG/2ML IJ SOLN
INTRAMUSCULAR | Status: DC | PRN
  Administered 2023-05-06: 4 mg via INTRAVENOUS

## 2023-05-06 MED ORDER — LACTATED RINGERS IV SOLN: INTRAVENOUS | Status: DC

## 2023-05-06 MED ORDER — FENTANYL CITRATE (PF) 100 MCG/2ML IJ SOLN: 25.0000 ug | INTRAMUSCULAR | Status: DC | PRN

## 2023-05-06 MED ORDER — KETOROLAC TROMETHAMINE 30 MG/ML IJ SOLN
INTRAMUSCULAR | Status: DC | PRN
  Administered 2023-05-06: 30 mg via INTRAVENOUS

## 2023-05-06 MED ORDER — SUGAMMADEX SODIUM 200 MG/2ML IV SOLN
INTRAVENOUS | Status: DC | PRN
  Administered 2023-05-06: 200 mg via INTRAVENOUS
  Administered 2023-05-06: 100 mg via INTRAVENOUS

## 2023-05-06 MED ORDER — FENTANYL CITRATE (PF) 250 MCG/5ML IJ SOLN
INTRAMUSCULAR | Status: DC | PRN
  Administered 2023-05-06: 100 ug via INTRAVENOUS

## 2023-05-06 MED ORDER — OXYCODONE HCL 5 MG/5ML PO SOLN: 5.0000 mg | Freq: Once | ORAL | Status: DC | PRN

## 2023-05-06 MED ORDER — OXYCODONE HCL 5 MG PO TABS
5.0000 mg | ORAL_TABLET | Freq: Four times a day (QID) | ORAL | 0 refills | Status: AC | PRN
  Filled 2023-05-06: qty 20, 5d supply, fill #0
  Filled 2023-05-06: qty 24, 6d supply, fill #0

## 2023-05-06 MED ORDER — DEXAMETHASONE SODIUM PHOSPHATE 10 MG/ML IJ SOLN
INTRAMUSCULAR | Status: DC | PRN
  Administered 2023-05-06: 10 mg via INTRAVENOUS

## 2023-05-06 MED ORDER — 0.9 % SODIUM CHLORIDE (POUR BTL) OPTIME
TOPICAL | Status: DC | PRN
  Administered 2023-05-06: 1000 mL

## 2023-05-06 MED ORDER — DEXMEDETOMIDINE HCL IN NACL 80 MCG/20ML IV SOLN
INTRAVENOUS | Status: DC | PRN
  Administered 2023-05-06: 8 ug via INTRAVENOUS
  Administered 2023-05-06: 4 ug via INTRAVENOUS
  Administered 2023-05-06: 8 ug via INTRAVENOUS

## 2023-05-06 MED ORDER — ALBUTEROL SULFATE (2.5 MG/3ML) 0.083% IN NEBU
INHALATION_SOLUTION | RESPIRATORY_TRACT | Status: AC
  Filled 2023-05-06: qty 3

## 2023-05-06 MED ORDER — CHLORHEXIDINE GLUCONATE CLOTH 2 % EX PADS: 6.0000 | MEDICATED_PAD | Freq: Once | CUTANEOUS | Status: DC

## 2023-05-06 MED ORDER — CEFAZOLIN SODIUM-DEXTROSE 2-4 GM/100ML-% IV SOLN: 2.0000 g | INTRAVENOUS | Status: DC

## 2023-05-06 MED ORDER — CEFAZOLIN SODIUM-DEXTROSE 2-4 GM/100ML-% IV SOLN
INTRAVENOUS | Status: AC
  Filled 2023-05-06: qty 100

## 2023-05-06 MED ORDER — ACETAMINOPHEN 500 MG PO TABS
ORAL_TABLET | ORAL | Status: AC
  Filled 2023-05-06: qty 2

## 2023-05-06 MED ORDER — ALBUTEROL SULFATE (2.5 MG/3ML) 0.083% IN NEBU
2.5000 mg | INHALATION_SOLUTION | Freq: Four times a day (QID) | RESPIRATORY_TRACT | Status: DC | PRN
  Administered 2023-05-06: 2.5 mg via RESPIRATORY_TRACT

## 2023-05-06 MED ORDER — ACETAMINOPHEN 500 MG PO TABS
1000.0000 mg | ORAL_TABLET | ORAL | Status: AC
  Administered 2023-05-06: 1000 mg via ORAL

## 2023-05-06 MED ORDER — MIDAZOLAM HCL 2 MG/2ML IJ SOLN
INTRAMUSCULAR | Status: AC
  Filled 2023-05-06: qty 2

## 2023-05-06 MED ORDER — CEFAZOLIN SODIUM-DEXTROSE 2-3 GM-%(50ML) IV SOLR
INTRAVENOUS | Status: DC | PRN
  Administered 2023-05-06: 2 g via INTRAVENOUS

## 2023-05-06 MED ORDER — ROCURONIUM BROMIDE 10 MG/ML (PF) SYRINGE
PREFILLED_SYRINGE | INTRAVENOUS | Status: DC | PRN
  Administered 2023-05-06: 60 mg via INTRAVENOUS

## 2023-05-06 MED ORDER — FENTANYL CITRATE (PF) 100 MCG/2ML IJ SOLN
INTRAMUSCULAR | Status: AC
  Filled 2023-05-06: qty 2

## 2023-05-06 MED ORDER — MIDAZOLAM HCL 2 MG/2ML IJ SOLN
INTRAMUSCULAR | Status: DC | PRN
  Administered 2023-05-06: 2 mg via INTRAVENOUS

## 2023-05-06 MED ORDER — ONDANSETRON HCL 4 MG/2ML IJ SOLN: 4.0000 mg | Freq: Four times a day (QID) | INTRAMUSCULAR | Status: DC | PRN

## 2023-05-06 MED ORDER — BUPIVACAINE-EPINEPHRINE 0.25% -1:200000 IJ SOLN
INTRAMUSCULAR | Status: DC | PRN
  Administered 2023-05-06: 30 mL

## 2023-05-06 MED ORDER — LIDOCAINE 2% (20 MG/ML) 5 ML SYRINGE
INTRAMUSCULAR | Status: DC | PRN
Start: 2023-05-06 — End: 2023-05-06
  Administered 2023-05-06: 60 mg via INTRAVENOUS

## 2023-05-06 MED ORDER — PROPOFOL 10 MG/ML IV BOLUS
INTRAVENOUS | Status: AC
  Filled 2023-05-06: qty 20

## 2023-05-06 MED ORDER — OXYCODONE HCL 5 MG PO TABS: 5.0000 mg | ORAL_TABLET | Freq: Once | ORAL | Status: DC | PRN

## 2023-05-06 SURGICAL SUPPLY — 39 items
BENZOIN TINCTURE PRP APPL 2/3 (GAUZE/BANDAGES/DRESSINGS) ×2 IMPLANT
BLADE CLIPPER SURG (BLADE) ×2 IMPLANT
BLADE SURG 10 STRL SS (BLADE) ×2 IMPLANT
CANISTER SUCT 1200ML W/VALVE (MISCELLANEOUS) ×2 IMPLANT
CLEANER CAUTERY TIP PAD (MISCELLANEOUS) ×2 IMPLANT
COVER BACK TABLE 60X90IN (DRAPES) ×2 IMPLANT
COVER MAYO STAND STRL (DRAPES) ×2 IMPLANT
DRAIN JP 7F FLT 3/4 PRF SI HBL (DRAIN) IMPLANT
DRAIN WOUND RND W/TROCAR (DRAIN) IMPLANT
DRAPE LAPAROTOMY T 102X78X121 (DRAPES) ×2 IMPLANT
DRAPE UTILITY XL STRL (DRAPES) ×2 IMPLANT
ELECT REM PT RETURN 9FT ADLT (ELECTROSURGICAL) ×1 IMPLANT
ELECTRODE REM PT RTRN 9FT ADLT (ELECTROSURGICAL) ×2 IMPLANT
EVACUATOR SILICONE 100CC (DRAIN) IMPLANT
GAUZE 4X4 16PLY ~~LOC~~+RFID DBL (SPONGE) IMPLANT
GAUZE PACKING IODOFORM 2INX5YD (GAUZE/BANDAGES/DRESSINGS) IMPLANT
GAUZE PAD ABD 8X10 STRL (GAUZE/BANDAGES/DRESSINGS) IMPLANT
GAUZE PETROLATUM 1 X8 (GAUZE/BANDAGES/DRESSINGS) IMPLANT
GAUZE SPONGE 4X4 12PLY STRL LF (GAUZE/BANDAGES/DRESSINGS) ×4 IMPLANT
GLOVE BIO SURGEON STRL SZ7 (GLOVE) ×2 IMPLANT
GLOVE BIOGEL PI IND STRL 7.5 (GLOVE) ×2 IMPLANT
GOWN STRL REUS W/ TWL LRG LVL3 (GOWN DISPOSABLE) ×2 IMPLANT
NDL HYPO 22X1.5 SAFETY MO (MISCELLANEOUS) ×2 IMPLANT
NEEDLE HYPO 22X1.5 SAFETY MO (MISCELLANEOUS) ×1 IMPLANT
PACK BASIN DAY SURGERY FS (CUSTOM PROCEDURE TRAY) ×2 IMPLANT
PENCIL SMOKE EVACUATOR (MISCELLANEOUS) ×2 IMPLANT
SPIKE FLUID TRANSFER (MISCELLANEOUS) IMPLANT
SPONGE T-LAP 4X18 ~~LOC~~+RFID (SPONGE) ×2 IMPLANT
STAPLER SKIN PROX WIDE 3.9 (STAPLE) IMPLANT
SUT BONE WAX W31G (SUTURE) IMPLANT
SUT ETHILON 2 0 FS 18 (SUTURE) IMPLANT
SUT ETHILON 3 0 PS 1 (SUTURE) IMPLANT
SUT VIC AB 2-0 CT1 TAPERPNT 27 (SUTURE) ×2 IMPLANT
SYR CONTROL 10ML LL (SYRINGE) ×2 IMPLANT
TAPE CLOTH 3X10 TAN LF (GAUZE/BANDAGES/DRESSINGS) ×2 IMPLANT
TAPE HYPAFIX 4 X10 (GAUZE/BANDAGES/DRESSINGS) IMPLANT
TRAY DSU PREP LF (CUSTOM PROCEDURE TRAY) ×2 IMPLANT
TUBE CONNECTING 20X1/4 (TUBING) ×2 IMPLANT
YANKAUER SUCT BULB TIP NO VENT (SUCTIONS) ×2 IMPLANT

## 2023-05-06 NOTE — Discharge Instructions (Addendum)
 Use the pain medication as needed.  You can supplement that with Tylenol and ibuprofen.  You may need to stool softener to avoid constipation.  Keep dry gauze over the incision to absorb any drainage.  Initially, you will have to change this several times a day.  The drainage should slow down.  You may shower but keep the wound covered while you are showering.  Change the dressing after you get out of shower.     Post Anesthesia Home Care Instructions  Activity: Get plenty of rest for the remainder of the day. A responsible individual must stay with you for 24 hours following the procedure.  For the next 24 hours, DO NOT: -Drive a car -Advertising copywriter -Drink alcoholic beverages -Take any medication unless instructed by your physician -Make any legal decisions or sign important papers.  Meals: Start with liquid foods such as gelatin or soup. Progress to regular foods as tolerated. Avoid greasy, spicy, heavy foods. If nausea and/or vomiting occur, drink only clear liquids until the nausea and/or vomiting subsides. Call your physician if vomiting continues.  Special Instructions/Symptoms: Your throat may feel dry or sore from the anesthesia or the breathing tube placed in your throat during surgery. If this causes discomfort, gargle with warm salt water. The discomfort should disappear within 24 hours.  If you had a scopolamine patch placed behind your ear for the management of post- operative nausea and/or vomiting:  1. The medication in the patch is effective for 72 hours, after which it should be removed.  Wrap patch in a tissue and discard in the trash. Wash hands thoroughly with soap and water. 2. You may remove the patch earlier than 72 hours if you experience unpleasant side effects which may include dry mouth, dizziness or visual disturbances. 3. Avoid touching the patch. Wash your hands with soap and water after contact with the patch.     Next dose of Tylenol may be given at  12:30pm if needed. Next dose of NSAIDs (Ibuprofen/Motrin/Aleve) may be given at 4:00pm if needed.

## 2023-05-06 NOTE — Interval H&P Note (Signed)
 History and Physical Interval Note:  05/06/2023 7:22 AM  Bryan Meadows.  has presented today for surgery, with the diagnosis of CHRONIC PILONIDAL ABSCESS.  The various methods of treatment have been discussed with the patient and family. After consideration of risks, benefits and other options for treatment, the patient has consented to  Procedure(s) with comments: EXCISION, PILONIDAL CYST (N/A) - PILONIDAL CYSTECTOMY as a surgical intervention.  The patient's history has been reviewed, patient examined, no change in status, stable for surgery.  I have reviewed the patient's chart and labs.  Questions were answered to the patient's satisfaction.     Wynona Luna

## 2023-05-06 NOTE — Anesthesia Preprocedure Evaluation (Signed)
 Anesthesia Evaluation  Patient identified by MRN, date of birth, ID band Patient awake    Reviewed: Allergy & Precautions, H&P , NPO status , Patient's Chart, lab work & pertinent test results  Airway Mallampati: II   Neck ROM: full    Dental   Pulmonary asthma    breath sounds clear to auscultation       Cardiovascular negative cardio ROS  Rhythm:regular Rate:Normal     Neuro/Psych    GI/Hepatic   Endo/Other    Class 3 obesity  Renal/GU      Musculoskeletal   Abdominal   Peds  Hematology   Anesthesia Other Findings   Reproductive/Obstetrics                             Anesthesia Physical Anesthesia Plan  ASA: 2  Anesthesia Plan: General   Post-op Pain Management:    Induction: Intravenous  PONV Risk Score and Plan: 2 and Ondansetron, Dexamethasone, Midazolam and Treatment may vary due to age or medical condition  Airway Management Planned: Oral ETT  Additional Equipment:   Intra-op Plan:   Post-operative Plan: Extubation in OR  Informed Consent: I have reviewed the patients History and Physical, chart, labs and discussed the procedure including the risks, benefits and alternatives for the proposed anesthesia with the patient or authorized representative who has indicated his/her understanding and acceptance.     Dental advisory given  Plan Discussed with: CRNA, Anesthesiologist and Surgeon  Anesthesia Plan Comments:        Anesthesia Quick Evaluation

## 2023-05-06 NOTE — Op Note (Signed)
 Pre-op diagnosis: Chronic pilonidal abscess Postop diagnosis: Same Procedure performed: Pilonidal cystectomy Surgeon:Sailor Hevia K Shahara Hartsfield Anesthesia: General Indications: This is a 18 year old male who is 2 years status post a pilonidal cystectomy with primary closure over Penrose drain.  That wound healed up well and has had no further problems.  However posteriorly, the patient has developed a another chronic pilonidal abscess.  He has not visible skin pits containing hair.  Posteriorly into the left there is a chronic abscess that is palpated under the skin.  He presents now for excision of the chronic abscess and skin pits.  Description of procedure: The patient is brought to the operating room placed in the supine position on the stretcher.  After an adequate level general anesthesia was obtained, he was moved to a prone position on the OR table.  His buttocks were taped apart.  We prepped the area over the sacrum and buttocks with Betadine.  The patient has 3 skin pits that seem to track into a small area that is palpated.  There is a small amount of clear drainage.  I made an elliptical incision to include all the skin pits as well as the skin overlying the palpable abscess.  We dissected down to the subcutaneous tissues with cautery.  We dissected completely around the cyst and removed entirely.  A small amount of purulent fluid was encountered.  I debrided all of the cyst wall back to healthy appearing tissue.  We infiltrated local anesthetic extensively around this area.  We irrigated thoroughly and inspected for hemostasis.  1/4 inch Penrose drain was placed deep into the wound posteriorly.  This was secured with a 2-0 Ethilon suture.  We then closed the wound over the Penrose drain with several deep subcutaneous sutures of 2-0 Vicryl.  The skin was reapproximated with interrupted horizontal mattress sutures of 2-0 nylon.  A dry dressing is applied.  The patient is then moved back to a supine position.   He was extubated and brought to recovery in stable condition.  All sponge, instrument, and needle counts are correct.  [name], MD, [facility] Surgery  General Surgery   05/06/2023 8:32 AM

## 2023-05-06 NOTE — Transfer of Care (Signed)
 Immediate Anesthesia Transfer of Care Note  Patient: Bryan Meadows.  Procedure(s) Performed: EXCISION, PILONIDAL CYST  Patient Location: PACU  Anesthesia Type:General  Level of Consciousness: drowsy  Airway & Oxygen Therapy: Patient Spontanous Breathing and Patient connected to face mask oxygen  Post-op Assessment: Report given to RN and Post -op Vital signs reviewed and stable  Post vital signs: Reviewed and stable  Last Vitals:  Vitals Value Taken Time  BP 110/55 05/06/23 0847  Temp 36.1 C 05/06/23 0847  Pulse 75 05/06/23 0851  Resp 20 05/06/23 0851  SpO2 94 % 05/06/23 0851  Vitals shown include unfiled device data.  Last Pain:  Vitals:   05/06/23 0847  TempSrc:   PainSc: Asleep      Patients Stated Pain Goal: 3 (05/06/23 1191)  Complications: Pt spasm and confused on wake up, despite having 20 mcg of precedex during case. Pt fighting oral airway and would not let CRNA hold mask to face. Remove OA and was able to mask pt with positive pressure and improved saturations prior to taking pt to PACU. Pt sats in low 90s and slightly wheezing. Notified MD and pt to get Albuterol Neb.

## 2023-05-06 NOTE — Anesthesia Procedure Notes (Signed)
 Procedure Name: Intubation Date/Time: 05/06/2023 7:41 AM  Performed by: Alvera Novel, CRNAPre-anesthesia Checklist: Patient identified, Emergency Drugs available, Suction available and Patient being monitored Patient Re-evaluated:Patient Re-evaluated prior to induction Oxygen Delivery Method: Circle system utilized Preoxygenation: Pre-oxygenation with 100% oxygen Induction Type: IV induction Ventilation: Mask ventilation without difficulty Grade View: Grade I Tube type: Oral Tube size: 7.0 mm Number of attempts: 1 Airway Equipment and Method: Stylet and Oral airway Placement Confirmation: ETT inserted through vocal cords under direct vision, positive ETCO2 and breath sounds checked- equal and bilateral Secured at: 23 cm Tube secured with: Tape Dental Injury: Teeth and Oropharynx as per pre-operative assessment

## 2023-05-09 ENCOUNTER — Encounter (HOSPITAL_BASED_OUTPATIENT_CLINIC_OR_DEPARTMENT_OTHER): Payer: Self-pay | Admitting: Surgery

## 2023-05-09 LAB — SURGICAL PATHOLOGY

## 2023-05-09 NOTE — Anesthesia Postprocedure Evaluation (Addendum)
 Anesthesia Post Note  Patient: Bryan Meadows.  Procedure(s) Performed: EXCISION, PILONIDAL CYST     Patient location during evaluation: PACU Anesthesia Type: General Level of consciousness: awake and alert Pain management: pain level controlled Vital Signs Assessment: post-procedure vital signs reviewed and stable Respiratory status: spontaneous breathing, nonlabored ventilation, respiratory function stable and patient connected to nasal cannula oxygen Cardiovascular status: blood pressure returned to baseline and stable Postop Assessment: no apparent nausea or vomiting Anesthetic complications: no   No notable events documented.  Last Vitals:  Vitals:   05/06/23 0945 05/06/23 1009  BP: 102/89 (!) 104/58  Pulse: 86 83  Resp: 17 20  Temp:  36.6 C  SpO2: 93% 93%    Last Pain:  Vitals:   05/06/23 1009  TempSrc: Temporal  PainSc: 0-No pain                 Jessicalynn Deshong S

## 2023-10-03 ENCOUNTER — Encounter (HOSPITAL_COMMUNITY): Payer: Self-pay

## 2023-10-03 ENCOUNTER — Other Ambulatory Visit: Payer: Self-pay

## 2023-10-03 ENCOUNTER — Emergency Department (HOSPITAL_COMMUNITY)
Admission: EM | Admit: 2023-10-03 | Discharge: 2023-10-03 | Disposition: A | Discharge status: Home / Self Care | Attending: Emergency Medicine | Admitting: Emergency Medicine

## 2023-10-03 ENCOUNTER — Other Ambulatory Visit (HOSPITAL_COMMUNITY): Payer: Self-pay

## 2023-10-03 DIAGNOSIS — W208XXA Other cause of strike by thrown, projected or falling object, initial encounter: Secondary | ICD-10-CM | POA: Diagnosis not present

## 2023-10-03 DIAGNOSIS — S0990XA Unspecified injury of head, initial encounter: Secondary | ICD-10-CM | POA: Diagnosis present

## 2023-10-03 MED ORDER — ACETAMINOPHEN 500 MG PO TABS
1000.0000 mg | ORAL_TABLET | Freq: Once | ORAL | Status: AC
Start: 2023-10-03 — End: 2023-10-03
  Administered 2023-10-03: 1000 mg via ORAL
  Filled 2023-10-03: qty 2

## 2023-10-03 MED ORDER — ACETAMINOPHEN 500 MG PO TABS
1000.0000 mg | ORAL_TABLET | Freq: Four times a day (QID) | ORAL | 0 refills | Status: AC | PRN
  Filled 2023-10-03: qty 30, 4d supply, fill #0

## 2023-10-03 NOTE — ED Triage Notes (Signed)
 Patient got hit in head with 20-25lb control panel (24x24x6), denies LOC, endorses light sensitivity, nausea has resolved, has swelling noted to right superior forehead. No thinners, able to answer questions appropriately.

## 2023-10-03 NOTE — ED Provider Notes (Signed)
  EMERGENCY DEPARTMENT AT [facility] Provider Note   CSN: [id number] Arrival date & time: 10/03/23  8279     Patient presents with: Head Injury   Bryan Meadows. is a 18 y.o. male.   The history is provided by the patient, medical records and a parent. No language interpreter was used.  Head Injury    18 year old male accompany by father to the ER for evaluation for head injury.  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 While holding the panel it slipped and struck the R side of his forehead.  The panel weigh approximately 20-25lbs.  He denies any LOC.  Endorse pain to R forehead, and now mild nausea and sligh pain to R eye.  Denies confusion, vomiting, neck pain, focal numbness.  No treatment tried.  No other complaint  Prior to Admission medications   Medication Sig Start Date End Date Taking? Authorizing Provider  albuterol  (PROVENTIL ) (2.5 MG/3ML) 0.083% nebulizer solution Take 3 mLs (2.5 mg total) by nebulization every 4 (four) hours as needed for wheezing. For shortness of breath 12/31/19   Duanne Butler DASEN, MD  albuterol  (VENTOLIN  HFA) 108 [phone] Base) MCG/ACT inhaler Inhale 1-2 puffs into the lungs every 6 (six) hours as needed for wheezing or shortness of breath. 01/21/22   Kayla Jeoffrey RAMAN, FNP  Clobetasol  Prop Emollient Base (CLOBETASOL  PROPIONATE E) 0.05 % emollient cream Apply 1 Application topically 2 (two) times daily. 07/15/22   Kayla Jeoffrey RAMAN, FNP  dicyclomine  (BENTYL ) 20 MG tablet Take 1 tablet (20 mg total) by mouth 3 (three) times daily before meals. Patient not taking: Reported on 05/05/2023 02/14/23   Freddi Hamilton, MD  meloxicam  (MOBIC ) 15 MG tablet Take 1 tablet by mouth in the morning with breakfast for 2 weeks, then daily as needed for pain. Patient not taking: Reported on 05/05/2023 01/12/23   Corey, Evan S, MD  oxyCODONE  (OXY IR/ROXICODONE ) 5 MG immediate release tablet Take 1 tablet (5 mg total) by mouth every  6 (six) hours as needed for severe pain (pain score 7-10). 05/06/23   Belinda Cough, MD  penicillin  v potassium (VEETID) 500 MG tablet Take 2 tablets by mouth now, then 1 tablet 4 times daily until finished for dental infection. Patient not taking: Reported on 05/05/2023 09/10/22   Stuart Clancy [place]idelberg, DDS  tiZANidine  (ZANAFLEX ) 4 MG tablet Take 1 tablet (4 mg total) by mouth every 8 (eight) hours as needed. 01/12/23   Corey, Evan S, MD    Allergies: Patient has no known allergies.    Review of Systems  Skin:  Negative for wound.  All other systems reviewed and are negative.   Updated Vital Signs BP (!) 148/83 (BP Location: Right Wrist)   Pulse 92   Temp 98.4 F (36.9 C)   Resp 17   Ht 5' 8 (1.727 m)   Wt 127.9 kg   SpO2 100%   BMI 42.88 kg/m   Physical Exam Constitutional:      General: He is not in acute distress.    Appearance: He is well-developed.     Comments: Resting comfortably, in no acute discomfort  HENT:     Head: Normocephalic and atraumatic.     Comments: Mild tenderness to R forehead without laceration, bruising, or swelling.  No midface tenderness, no battle's sign or raccoon's eye.    Eye exam normal.  Eyes:     Extraocular Movements: Extraocular movements intact.  Conjunctiva/sclera: Conjunctivae normal.     Pupils: Pupils are equal, round, and reactive to light.  Musculoskeletal:     Cervical back: Normal range of motion and neck supple. No rigidity or tenderness.  Skin:    Findings: No rash.  Neurological:     Mental Status: He is alert and oriented to person, place, and time.  Psychiatric:        Mood and Affect: Mood normal.     (all labs ordered are listed, but only abnormal results are displayed) Labs Reviewed - No data to display  EKG: None  Radiology: No results found.   Procedures   Medications Ordered in the ED  acetaminophen  (TYLENOL ) tablet 1,000 mg (has no administration in time range)                                     Medical Decision Making Risk OTC drugs.   BP (!) 148/83 (BP Location: Right Wrist)   Pulse 92   Temp 98.4 F (36.9 C)   Resp 17   Ht 5' 8 (1.727 m)   Wt 127.9 kg   SpO2 100%   BMI 42.88 kg/m   24:76 PM  18 year old male accompany by father to the ER for evaluation for head injury.  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 While holding the panel it slipped and struck the R side of his forehead.  The panel weigh approximately 20-25lbs.  He denies any LOC.  Endorse pain to R forehead, and now mild nausea and sligh pain to R eye.  Denies confusion, vomiting, neck pain, focal numbness.  No treatment tried.  No other complaint  Exam are reassuring.  Mild ttp to R forehead without significant injury. No indication for head CT per PECARN.  Negative NEXUS criteria.    Discussed option of head CT vs watchful waiting including risk/benefit.  Pt opted for no imaging, agrees with shared decision making . D/c him with supportive care and return precaution discussed. Tylenol  given for pain with improvement of sxs.      Final diagnoses:  Minor head injury, initial encounter    ED Discharge Orders          Ordered    acetaminophen  (TYLENOL ) 500 MG tablet  Every 6 hours PRN        10/03/23 1905               Nivia Colon, PA-C 10/03/23 1906    Patsey Lot, MD 10/03/23 2351

## 2023-10-04 ENCOUNTER — Ambulatory Visit: Admitting: Sports Medicine

## 2023-10-04 ENCOUNTER — Other Ambulatory Visit (HOSPITAL_COMMUNITY): Payer: Self-pay
